# Patient Record
Sex: Male | Born: 1975 | Race: White | Hispanic: No | Marital: Married | Smoking: Former smoker
Health system: Southern US, Community
[De-identification: ages and names within clinical notes are randomized; demographics above are authoritative.]

## PROBLEM LIST (undated history)

## (undated) HISTORY — PX: NO PAST SURGERIES: SHX2092

---

## 2007-08-28 ENCOUNTER — Ambulatory Visit: Payer: Self-pay | Admitting: Internal Medicine

## 2007-09-07 ENCOUNTER — Ambulatory Visit: Payer: Self-pay | Admitting: Internal Medicine

## 2008-06-08 IMAGING — CT CT ABDOMEN W/ CM
1 of 2 series · 15 of 32 positions shown, 20 images · non-contrast
Comparison: none

REASON FOR EXAM: abnormal LFT's    abd pain
COMMENTS:

[Series 2: soft tissue · axial · 0.98mm/px · z∈[+372,+692]mm · 15 of 46 slices shown, 20 images]
[im 3/46  soft-tissue]
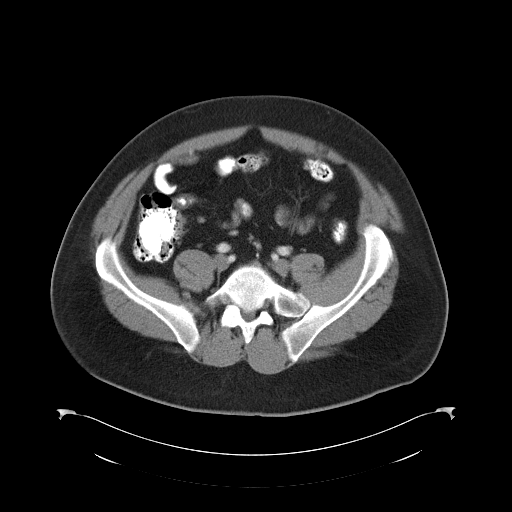
[im 3/46  bone]
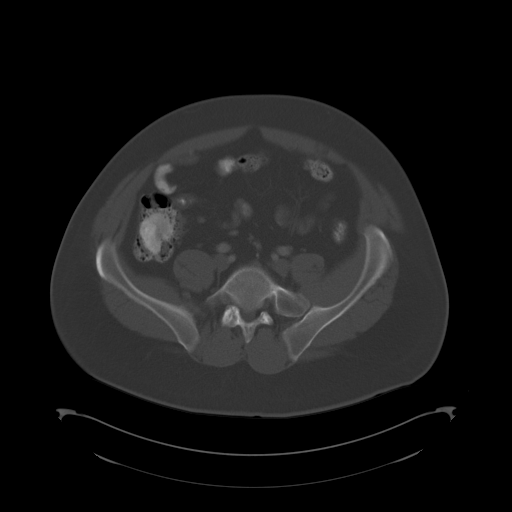
[im 7/46  soft-tissue]
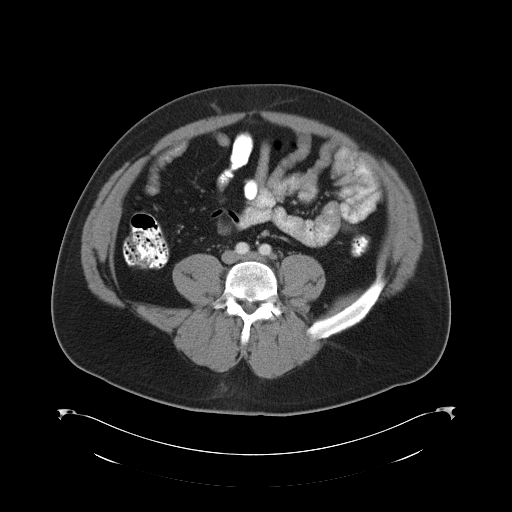
[im 10/46  soft-tissue]
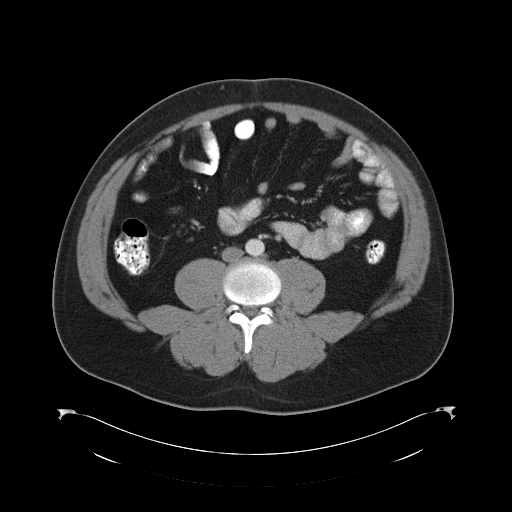
[im 12/46  soft-tissue]
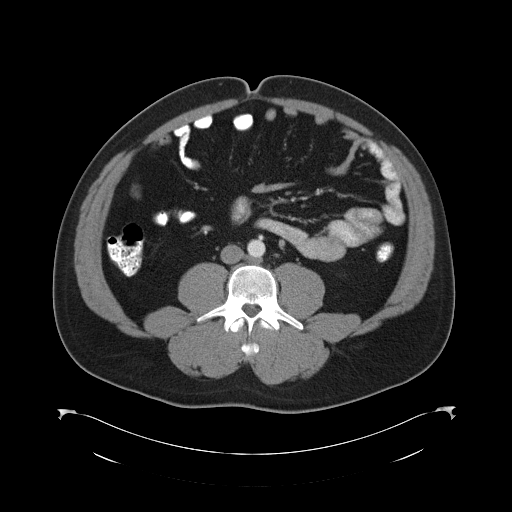
[im 16/46  soft-tissue]
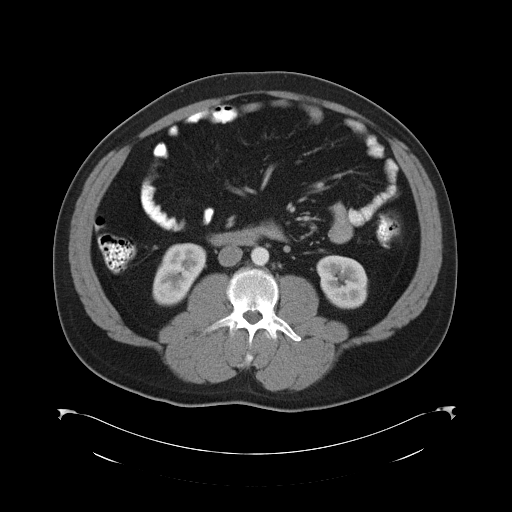
[im 19/46  soft-tissue]
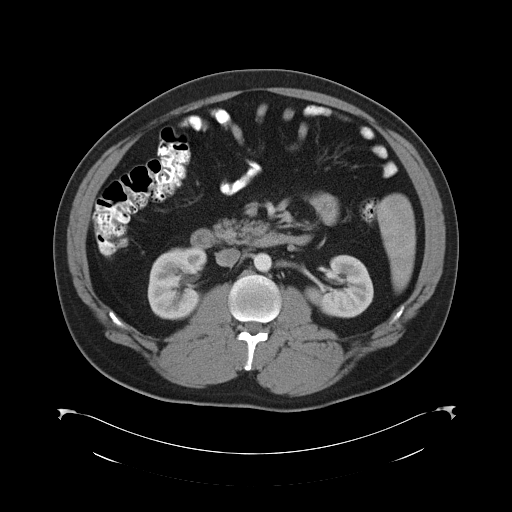
[im 21/46  soft-tissue]
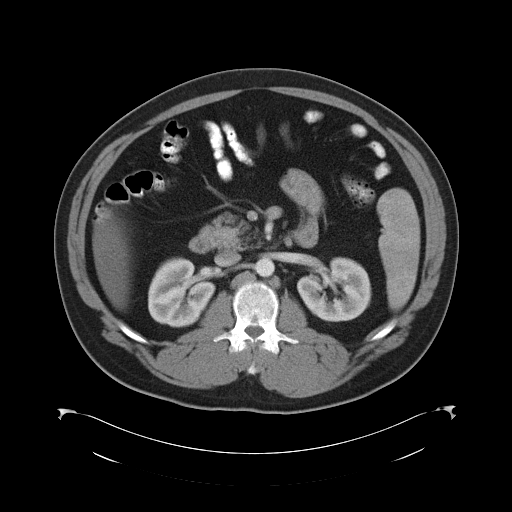
[im 25/46  soft-tissue]
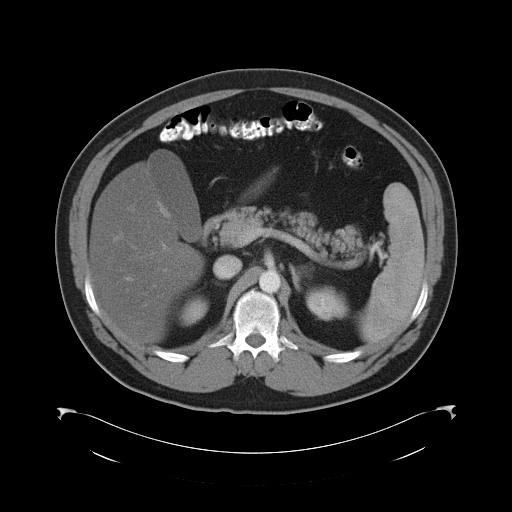
[im 28/46  soft-tissue]
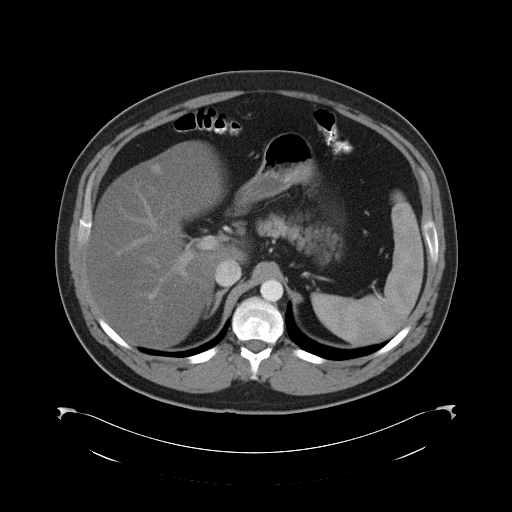
[im 28/46  bone]
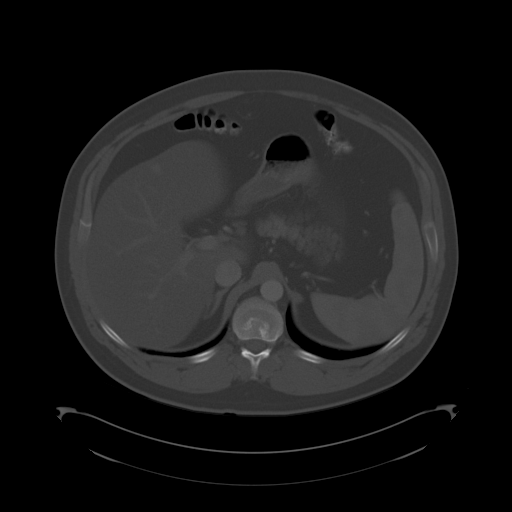
[im 30/46  soft-tissue]
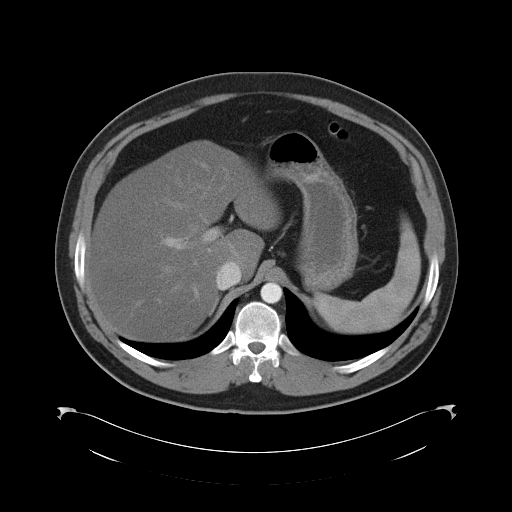
[im 34/46  soft-tissue]
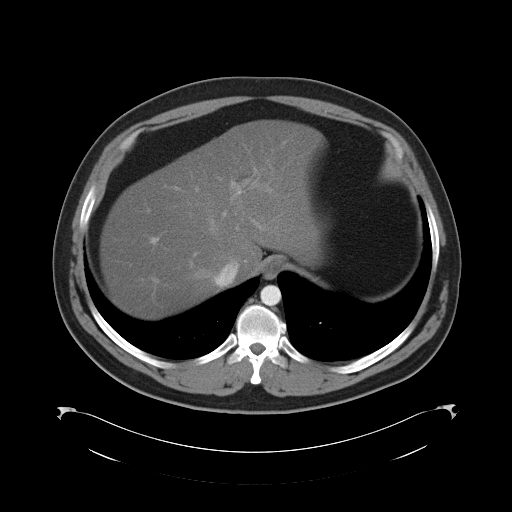
[im 37/46  soft-tissue]
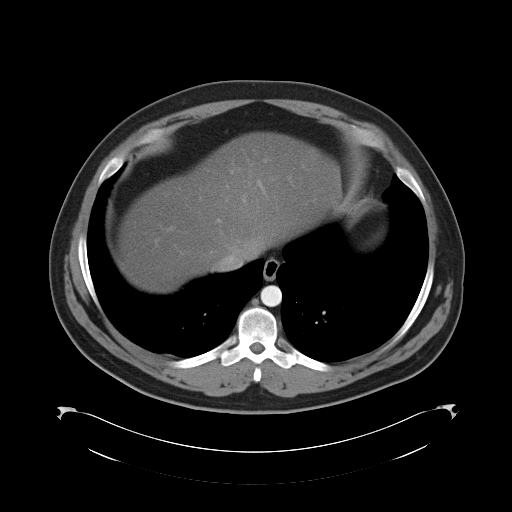
[im 37/46  lung]
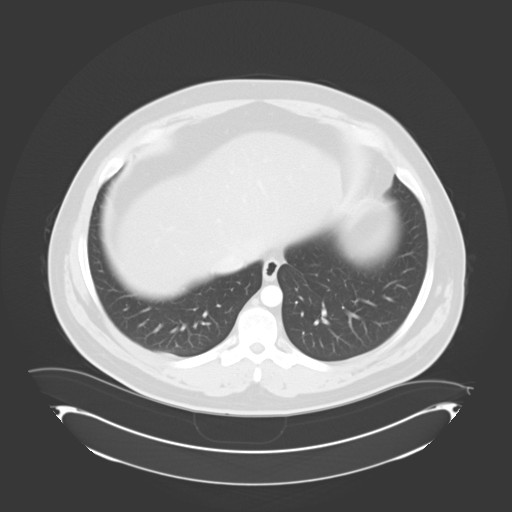
[im 39/46  soft-tissue]
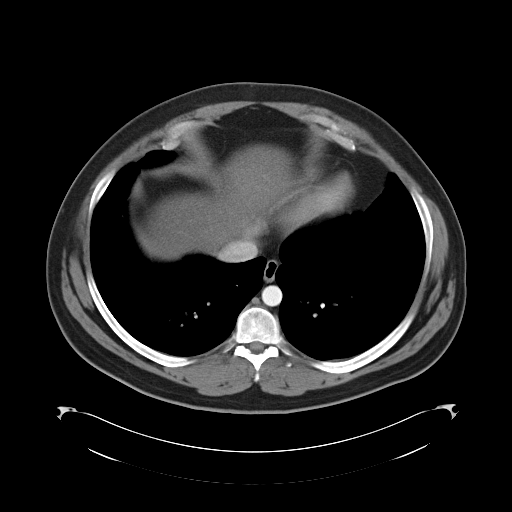
[im 39/46  lung]
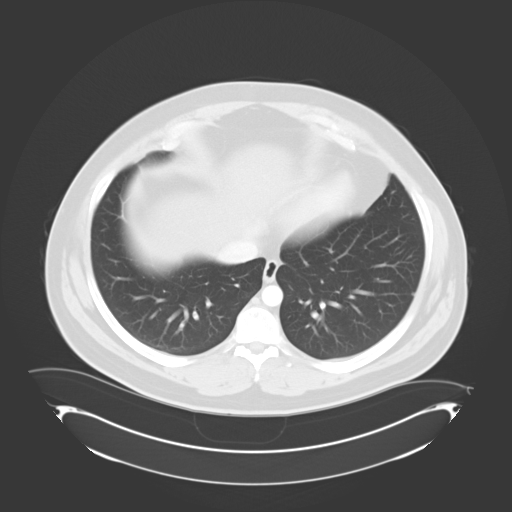
[im 41/46  lung]
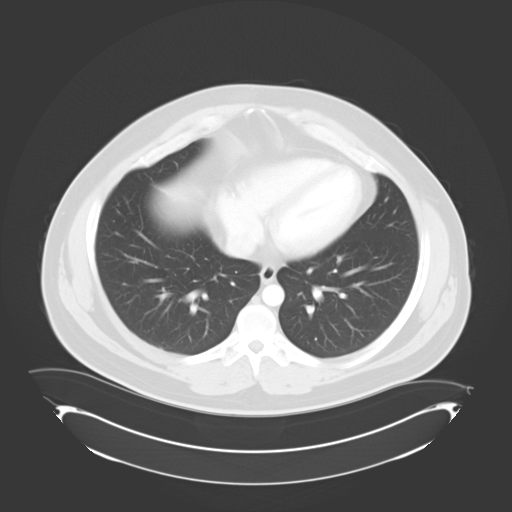
[im 43/46  soft-tissue]
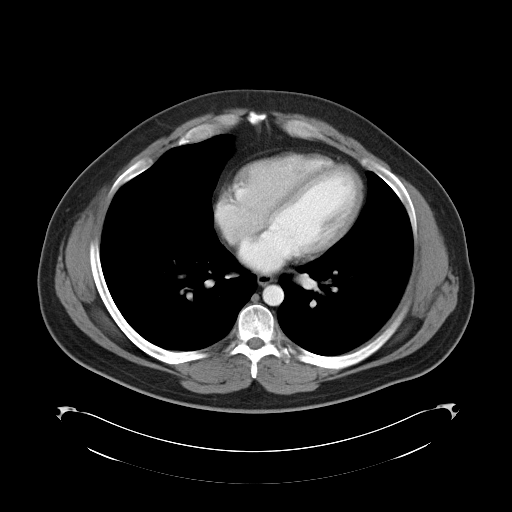
[im 43/46  lung]
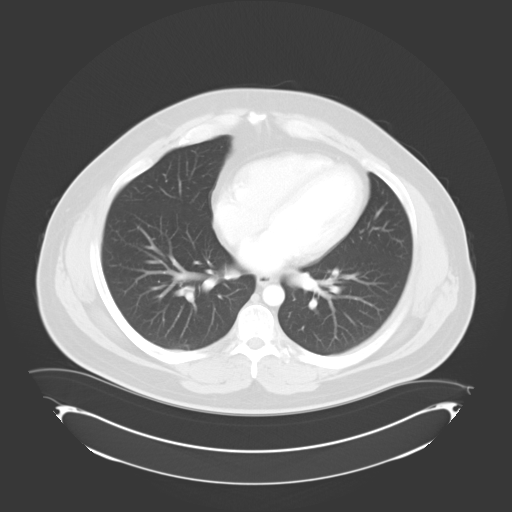

[15 of 32 positions shown; findings below may reference images not displayed]

PROCEDURE:     CT  - CT ABDOMEN STANDARD W  - September 07, 2007  [DATE]

RESULT:     Helical 8 mm sections were obtained from the lung bases through
the superior iliac crests.

Evaluation of the lung bases demonstrates no gross abnormalities. Evaluation
of the liver demonstrates diffuse low-attenuating architecture without
evidence of focal regions of abnormal enhancement nor focal masses. The
spleen, adrenals, pancreas, and kidneys are unremarkable. There is no CT
evidence of abdominal free fluid, loculated fluid collections, masses nor
adenopathy,  nor CT evidence of bowel obstruction, colitis, or appendicitis
within the visualized portions of the abdomen.   There is no CT evidence of
an abdominal aortic aneurysm.
IMPRESSION: Findings consistent with hepatic steatosis without further
focal or acute intra-abdominal abnormalities.

## 2009-01-03 ENCOUNTER — Ambulatory Visit: Payer: Self-pay | Admitting: Internal Medicine

## 2010-07-10 IMAGING — CR DG FOREARM 2V*L*
1 series · 2 of 2 positions shown · non-contrast
Comparison: none

REASON FOR EXAM: injruy
COMMENTS:

PROCEDURE:     MDR - MDR FOREARM LEFT  - January 03, 2009  [DATE]
RESULT:     AP and lateral views of the left forearm were obtained. There is
a comminuted fracture of the distal left radius. No fractures of the
proximal radius are seen. No fracture of the ulna is noted.

[Series 1: view not recorded · 0.17mm/px · 2 of 2 slices shown]
[im 1/2]
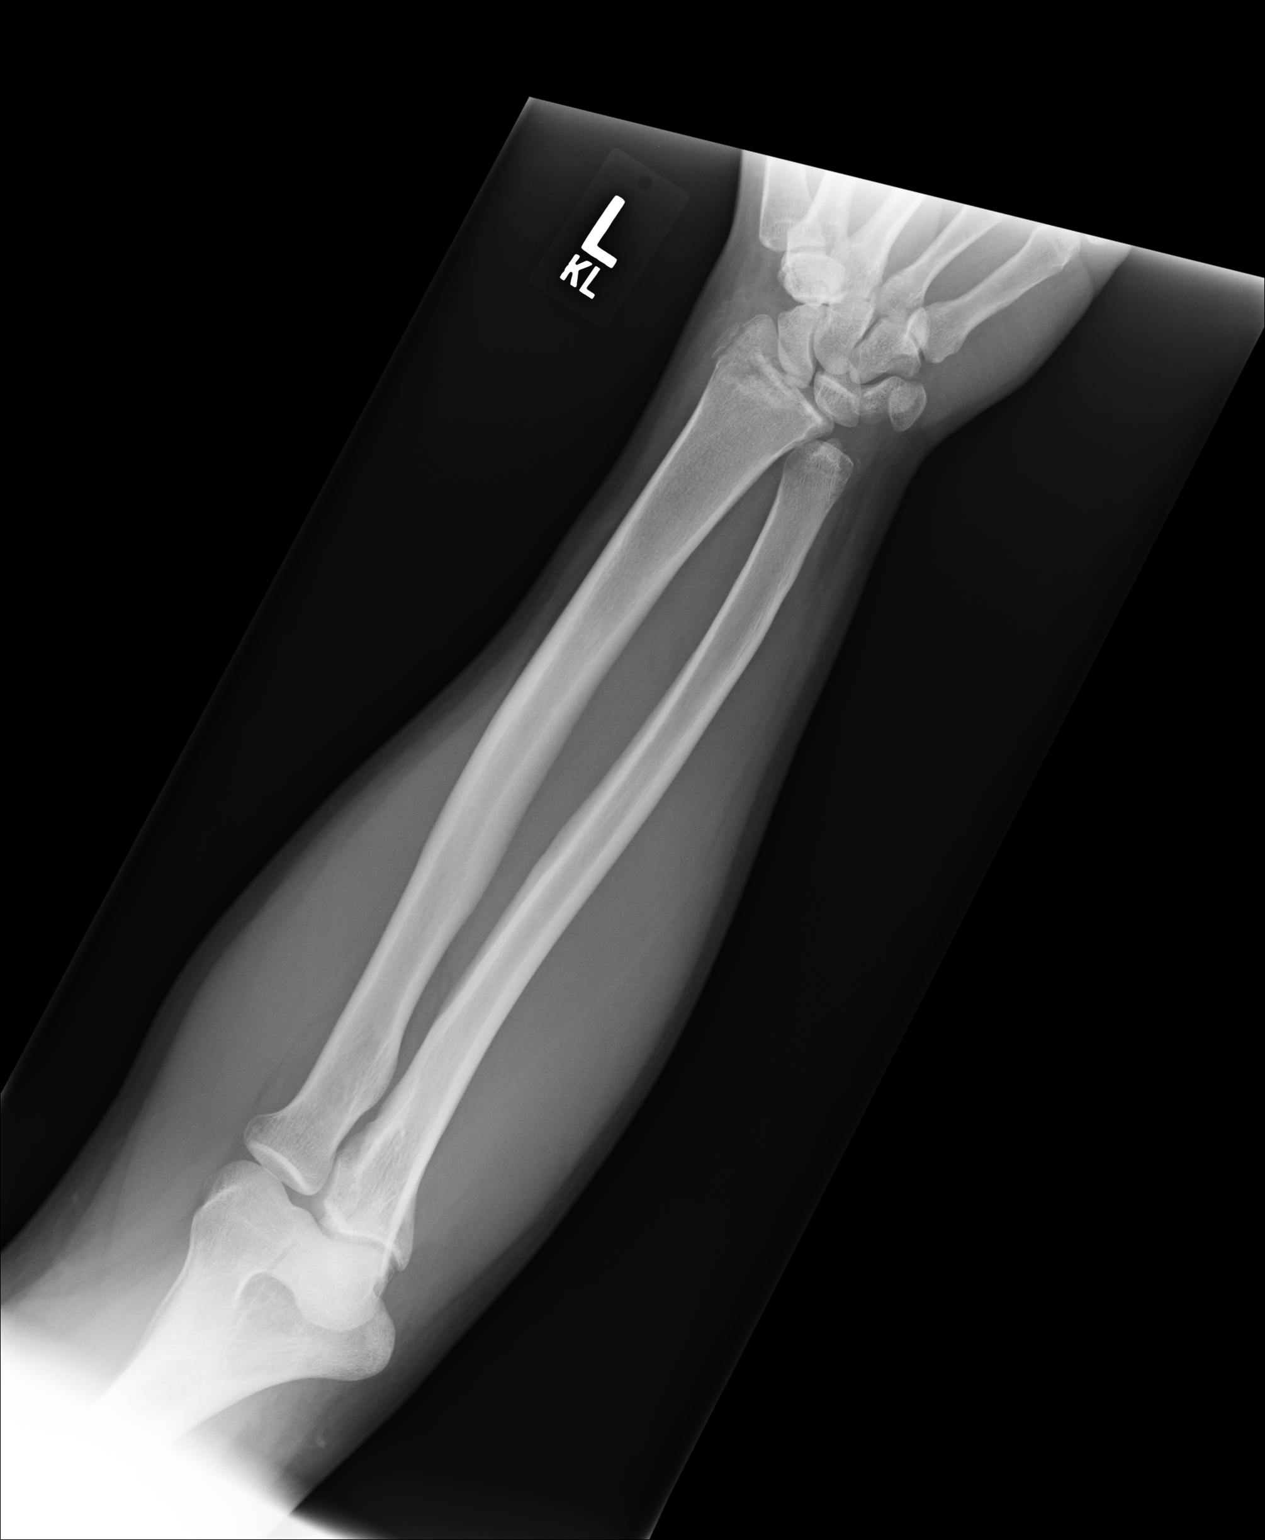
[im 2/2]
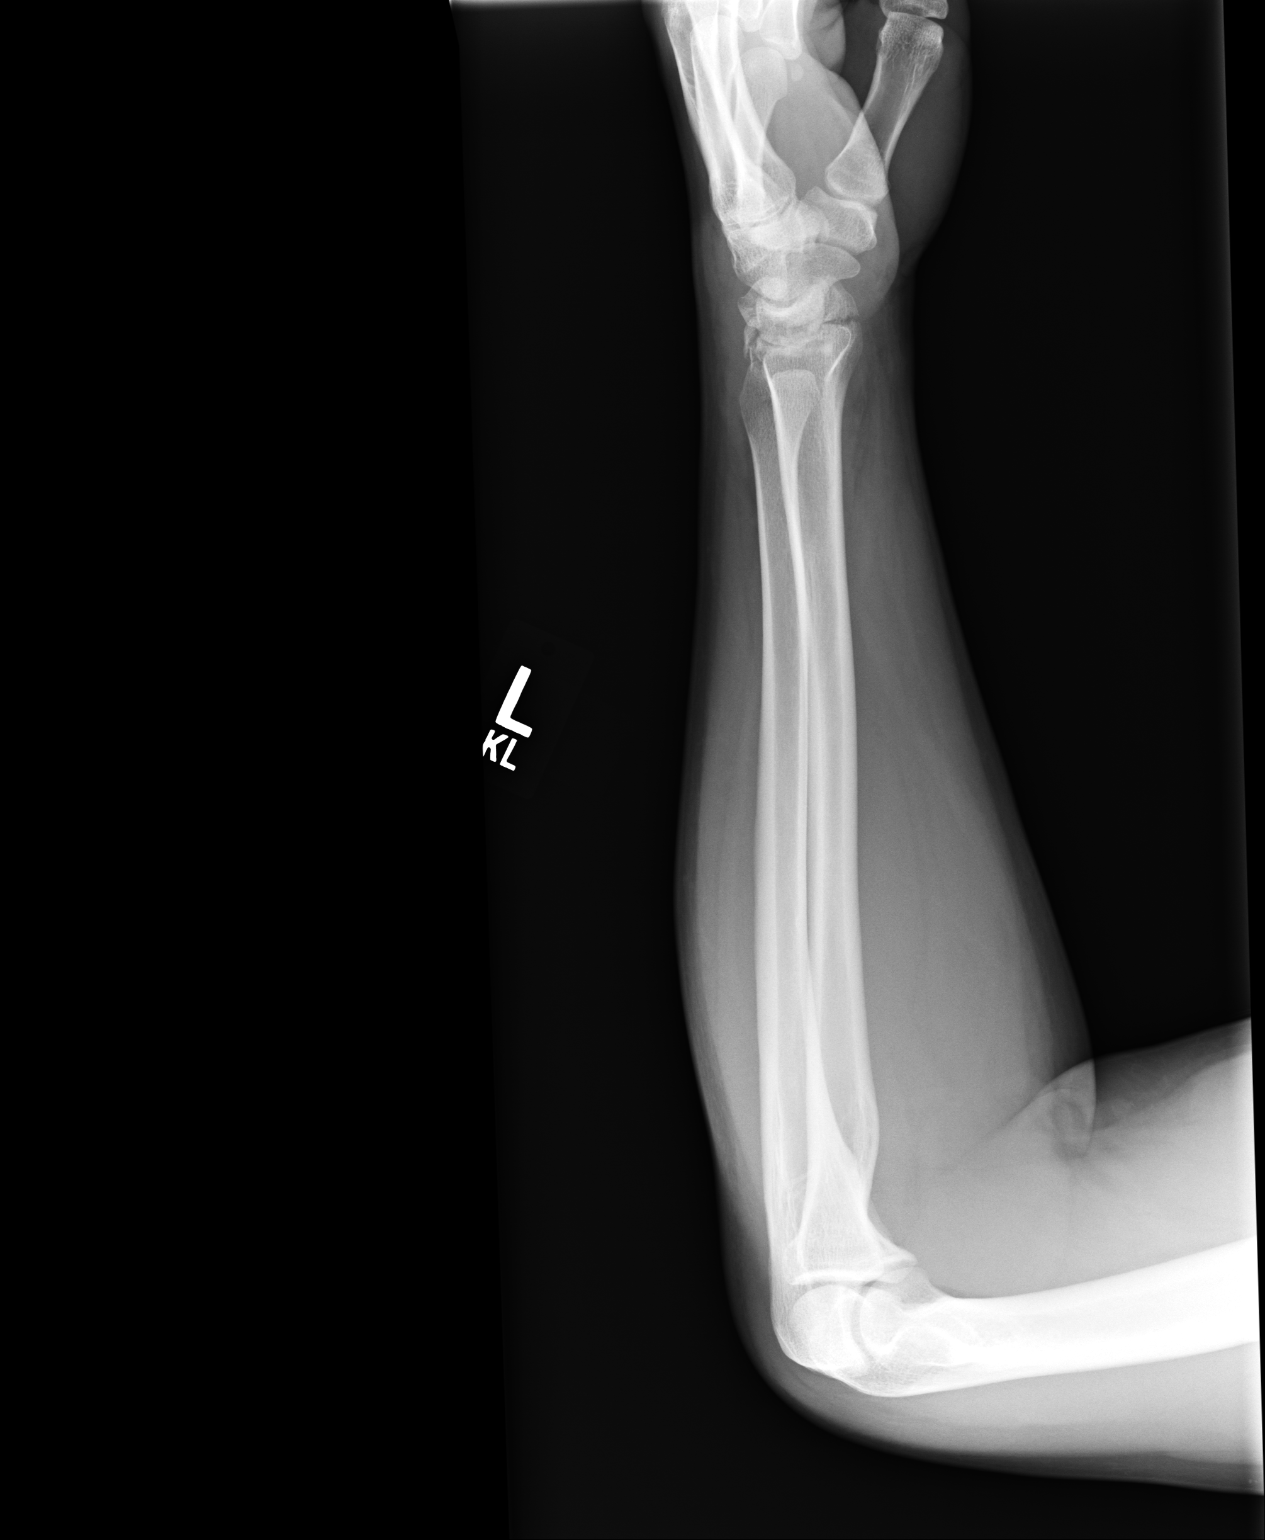

[2 of 2 positions shown; findings below may reference images not displayed]

IMPRESSION: Comminuted fracture of the distal left radius.

## 2016-08-19 ENCOUNTER — Ambulatory Visit: Payer: Self-pay | Admitting: Physician Assistant

## 2016-08-19 ENCOUNTER — Encounter: Payer: Self-pay | Admitting: Physician Assistant

## 2016-08-19 VITALS — BP 144/92 | HR 82 | Temp 98.3°F | Ht 70.0 in | Wt 235.0 lb

## 2016-08-19 DIAGNOSIS — Z0000 Encounter for general adult medical examination without abnormal findings: Secondary | ICD-10-CM

## 2016-08-19 DIAGNOSIS — F5102 Adjustment insomnia: Secondary | ICD-10-CM

## 2016-08-19 MED ORDER — TRAZODONE HCL 150 MG PO TABS: ORAL_TABLET | ORAL | 3 refills | Status: DC

## 2016-08-19 NOTE — Progress Notes (Signed)
S: pt here for wellness physical for insurance purposes, states he had his biometrics done at work, does not have a pcp, states he use to see dr sparks years ago, had elevated bp but became normal after losing about 100lbs, states he is under a lot of stress recently has he and his fiance broke up, no si/hi, having difficulty sleeping, took a lot of benadryl without relief;  No other complaints ros neg. PMH:  Elevated bp Social: dips snuff, nonsmoker, +etoh, no drugs Fam: leukemia, dementia, CAD, htn, ?dm  O: vitals wnl, nad, ENT wnl, neck supple no lymph, lungs c t a, cv rrr, abd soft nontender bs normal all 4 quads, pt is a little nervous  A: wellness, insomnia  P:  Recheck bp, take otc cinnamon, bp may be elevated due to recent caffeine intact and anxiety, rx for trazadone for insomnia

## 2017-03-16 ENCOUNTER — Ambulatory Visit: Payer: Self-pay | Admitting: Physician Assistant

## 2017-03-16 DIAGNOSIS — Z299 Encounter for prophylactic measures, unspecified: Secondary | ICD-10-CM

## 2017-03-16 NOTE — Progress Notes (Signed)
Patient came in to have his scheduled flu vaccine. 

## 2017-03-21 ENCOUNTER — Ambulatory Visit: Payer: Self-pay | Admitting: Physician Assistant

## 2017-03-30 ENCOUNTER — Ambulatory Visit: Payer: Self-pay | Admitting: Physician Assistant

## 2017-03-30 ENCOUNTER — Encounter: Payer: Self-pay | Admitting: Physician Assistant

## 2017-03-30 VITALS — BP 150/100 | HR 87 | Temp 98.4°F | Wt 259.0 lb

## 2017-03-30 DIAGNOSIS — J069 Acute upper respiratory infection, unspecified: Secondary | ICD-10-CM

## 2017-03-30 DIAGNOSIS — E669 Obesity, unspecified: Secondary | ICD-10-CM

## 2017-03-30 MED ORDER — BENZONATATE 200 MG PO CAPS: 200.0000 mg | ORAL_CAPSULE | Freq: Three times a day (TID) | ORAL | 0 refills | Status: DC | PRN

## 2017-03-30 NOTE — Progress Notes (Signed)
S: Patient here for weight loss consultation, states he is getting short of breath when he runs up steps needs to get the weight off. Has used phentermine previously. Was able to lose a lot of weight on this medication. Also has cough and congestion, had fever on Friday, he is getting better at this time. States he started some leftover amoxicillin. States he has enough to do 7 days worth of medication.  O: vitals w elevated bp, weight 259lb; TMs clear, throat is irritated, neck is supple, lungs are clear to auscultation, heart sounds are normal.  A: obesity, acute uri  P: Discussed diet options with patient, instructed him to cut carbs and eat more protein with vegetables. Explained to him that since I am leaving the clinic I don't feel comfortable starting him on phentermine. We'll call in [place] for his cough

## 2017-06-03 ENCOUNTER — Other Ambulatory Visit: Payer: Self-pay

## 2017-06-03 ENCOUNTER — Ambulatory Visit
Admission: EM | Admit: 2017-06-03 | Discharge: 2017-06-03 | Disposition: A | Discharge status: Home / Self Care | Payer: Managed Care, Other (non HMO) | Attending: Family Medicine | Admitting: Family Medicine

## 2017-06-03 DIAGNOSIS — R05 Cough: Secondary | ICD-10-CM | POA: Diagnosis not present

## 2017-06-03 DIAGNOSIS — M62830 Muscle spasm of back: Secondary | ICD-10-CM | POA: Diagnosis not present

## 2017-06-03 DIAGNOSIS — J029 Acute pharyngitis, unspecified: Secondary | ICD-10-CM

## 2017-06-03 LAB — RAPID STREP SCREEN (MED CTR MEBANE ONLY): Streptococcus, Group A Screen (Direct): NEGATIVE

## 2017-06-03 MED ORDER — AZITHROMYCIN 250 MG PO TABS: 250.0000 mg | ORAL_TABLET | Freq: Every day | ORAL | 0 refills | Status: DC

## 2017-06-03 MED ORDER — CYCLOBENZAPRINE HCL 10 MG PO TABS: 10.0000 mg | ORAL_TABLET | Freq: Two times a day (BID) | ORAL | 0 refills | Status: DC | PRN

## 2017-06-03 NOTE — ED Provider Notes (Signed)
[facility]    CSN: [id number] Arrival date & time: 06/03/17  0906     History   Chief Complaint Chief Complaint  Patient presents with  . Sore Throat    HPI Michael Coffey is a 42 y.o. male.   HPI  42 year old male presents with a sore throat that feels like he "swallowed a bucket of nails" congestion, body aches and fatigue that started suddenly yesterday.  He works as a Glass blower/designer at [employer] A coworker was diagnosed with strep throat here last week.  Has been in close contact with him.  Temperature of 99.1 O2 sats at 90%.  He does admit to a mild cough that is nonproductive.  He states whenever he coughs it hurts his throat so badly he tries to avoid it.       History reviewed. No pertinent past medical history.  There are no active problems to display for this patient.   Past Surgical History:  Procedure Laterality Date  . NO PAST SURGERIES         Home Medications    Prior to Admission medications   Medication Sig Start Date End Date Taking? Authorizing Provider  azithromycin (ZITHROMAX) 250 MG tablet Take 1 tablet (250 mg total) by mouth daily. Take first 2 tablets together, then 1 every day until finished. 06/03/17   Lutricia Feil, PA-C  cyclobenzaprine (FLEXERIL) 10 MG tablet Take 1 tablet (10 mg total) by mouth 2 (two) times daily as needed for muscle spasms. 06/03/17   Lutricia Feil, PA-C    Family History Family History  Problem Relation Age of Onset  . Leukemia Mother   . Hypertension Father   . Hypertension Brother   . Dementia Maternal Grandmother   . Heart disease Maternal Grandfather   . Heart disease Paternal Grandfather     Social History Social History   Tobacco Use  . Smoking status: Former Games developer  . Smokeless tobacco: Current User    Types: Snuff  Substance Use Topics  . Alcohol use: Yes    Comment: rarely  . Drug use: No     Allergies   Patient has no known allergies.   Review of Systems Review  of Systems  Constitutional: Positive for activity change, chills, fatigue and fever.  HENT: Positive for congestion, postnasal drip and sore throat.   Respiratory: Positive for cough.   All other systems reviewed and are negative.    Physical Exam Triage Vital Signs ED Triage Vitals  Enc Vitals Group     BP 06/03/17 0930 (!) 146/80     Pulse Rate 06/03/17 0930 93     Resp 06/03/17 0930 18     Temp 06/03/17 0930 99.1 F (37.3 C)     Temp Source 06/03/17 0930 Oral     SpO2 06/03/17 0930 98 %     Weight 06/03/17 0926 250 lb (113.4 kg)     Height 06/03/17 0926 5\' 10"  (1.778 m)     Head Circumference --      Peak Flow --      Pain Score 06/03/17 0926 10     Pain Loc --      Pain Edu? --      Excl. in GC? --    No data found.  Updated Vital Signs BP (!) 146/80 (BP Location: Left Arm)   Pulse 93   Temp 99.1 F (37.3 C) (Oral)   Resp 18   Ht 5\' 10"  (1.778 m)  Wt 250 lb (113.4 kg)   SpO2 98%   BMI 35.87 kg/m   Visual Acuity Right Eye Distance:   Left Eye Distance:   Bilateral Distance:    Right Eye Near:   Left Eye Near:    Bilateral Near:     Physical Exam  Constitutional: He is oriented to person, place, and time. He appears well-developed and well-nourished.  Non-toxic appearance. He does not appear ill. No distress.  HENT:  Head: Normocephalic.  Right Ear: Hearing and tympanic membrane normal.  Left Ear: Hearing and tympanic membrane normal.  Mouth/Throat: Mucous membranes are normal. Posterior oropharyngeal edema and posterior oropharyngeal erythema present. No oropharyngeal exudate or tonsillar abscesses. Tonsils are 2+ on the right. Tonsils are 1+ on the left. No tonsillar exudate.  Eyes: Pupils are equal, round, and reactive to light.  Neck: Normal range of motion.  Pulmonary/Chest: Effort normal and breath sounds normal.  Lymphadenopathy:    He has no cervical adenopathy.  Neurological: He is alert and oriented to person, place, and time.  Skin: Skin  is warm and dry.  Psychiatric: He has a normal mood and affect. His behavior is normal.  Nursing note and vitals reviewed.    UC Treatments / Results  Labs (all labs ordered are listed, but only abnormal results are displayed) Labs Reviewed  RAPID STREP SCREEN (NOT AT [facility])  CULTURE, GROUP A STREP [facility])    EKG  EKG Interpretation None       Radiology No results found.  Procedures Procedures (including critical care time)  Medications Ordered in UC Medications - No data to display   Initial Impression / Assessment and Plan / UC Course  I have reviewed the triage vital signs and the nursing notes.  Pertinent labs & imaging results that were available during my care of the patient were reviewed by me and considered in my medical decision making (see chart for details).     Plan: 1. Test/x-ray results and diagnosis reviewed with patient 2. rx as per orders; risks, benefits, potential side effects reviewed with patient 3. Recommend supportive treatment with rest and fluids.  Recommend salt water gargles as necessary for comfort.  May use lozenges or cough drops also.  For your back spasms will provide you with Flexeril which has worked in the past.  Cultures of your throat will be available however I have started him on Z-Pak because of his symptoms physical exam and also association with a strep positive individual.  If he is not improving he should follow-up with Dr. Judithann SheenSparks his primary care physician 4. F/u prn if symptoms worsen or don't improve   Final Clinical Impressions(s) / UC Diagnoses   Final diagnoses:  Sore throat  Spasm of back muscles    ED Discharge Orders        Ordered    azithromycin (ZITHROMAX) 250 MG tablet  Daily     06/03/17 1008    cyclobenzaprine (FLEXERIL) 10 MG tablet  2 times daily PRN     06/03/17 1008       Controlled Substance Prescriptions [place] Controlled Substance Registry consulted? Not Applicable   Lutricia FeilRoemer, William P,  PA-C 06/03/17 1019

## 2017-06-03 NOTE — ED Triage Notes (Signed)
Patient complains of sore throat, congestion, cough, body aches and fatigue x suddenly yesterday. 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

## 2017-06-05 LAB — CULTURE, GROUP A STREP (THRC)

## 2017-06-06 ENCOUNTER — Encounter: Payer: Self-pay | Admitting: *Deleted

## 2017-06-06 ENCOUNTER — Ambulatory Visit
Admission: EM | Admit: 2017-06-06 | Discharge: 2017-06-06 | Disposition: A | Discharge status: Home / Self Care | Payer: Managed Care, Other (non HMO) | Attending: Family Medicine | Admitting: Family Medicine

## 2017-06-06 ENCOUNTER — Telehealth: Payer: Self-pay | Admitting: Emergency Medicine

## 2017-06-06 DIAGNOSIS — B9789 Other viral agents as the cause of diseases classified elsewhere: Secondary | ICD-10-CM

## 2017-06-06 DIAGNOSIS — J029 Acute pharyngitis, unspecified: Secondary | ICD-10-CM

## 2017-06-06 DIAGNOSIS — R509 Fever, unspecified: Secondary | ICD-10-CM | POA: Diagnosis not present

## 2017-06-06 LAB — MONONUCLEOSIS SCREEN: MONO SCREEN: NEGATIVE

## 2017-06-06 LAB — RAPID STREP SCREEN (MED CTR MEBANE ONLY): Streptococcus, Group A Screen (Direct): NEGATIVE

## 2017-06-06 MED ORDER — PREDNISONE 20 MG PO TABS: ORAL_TABLET | ORAL | 0 refills | Status: DC

## 2017-06-06 MED ORDER — LIDOCAINE VISCOUS 2 % MT SOLN: OROMUCOSAL | 0 refills | Status: DC

## 2017-06-06 NOTE — ED Triage Notes (Signed)
Sore throat, fever, chills,  xseveral days. Seen here Friday for same.

## 2017-06-06 NOTE — ED Provider Notes (Signed)
[facility]    CSN: [id number] Arrival date & time: 06/06/17  1021     History   Chief Complaint Chief Complaint  Patient presents with  . Sore Throat  . Fever    HPI Michael Coffey is a 42 y.o. male.   42 yo male seen 4 days ago with 2 days of sore throat and given z-pak due to exposure to strep, however strep test was negative. Patient now returns stating he's not doing much better. States today feels slightly better but expected to be much better by now with antibiotic. Denies any fevers, chills, cough, shortness of breath.    The history is provided by the patient.  Sore Throat   Fever    History reviewed. No pertinent past medical history.  There are no active problems to display for this patient.   Past Surgical History:  Procedure Laterality Date  . NO PAST SURGERIES         Home Medications    Prior to Admission medications   Medication Sig Start Date End Date Taking? Authorizing Provider  azithromycin (ZITHROMAX) 250 MG tablet Take 1 tablet (250 mg total) by mouth daily. Take first 2 tablets together, then 1 every day until finished. 06/03/17  Yes Lutricia Feil, PA-C  cyclobenzaprine (FLEXERIL) 10 MG tablet Take 1 tablet (10 mg total) by mouth 2 (two) times daily as needed for muscle spasms. 06/03/17  Yes Lutricia Feil, PA-C  lidocaine (XYLOCAINE) 2 % solution 20 mls gargle and spit q 6 hours prn 06/06/17   Payton Mccallum, MD  predniSONE (DELTASONE) 20 MG tablet 3 tabs po qd x 2 days, then 2 tabs po qd x 2 days, then 1 tab po qd x 2 days, then half a tab po qd x 2 days 06/06/17   Payton Mccallum, MD    Family History Family History  Problem Relation Age of Onset  . Leukemia Mother   . Hypertension Father   . Hypertension Brother   . Dementia Maternal Grandmother   . Heart disease Maternal Grandfather   . Heart disease Paternal Grandfather     Social History Social History   Tobacco Use  . Smoking status: Former Games developer  .  Smokeless tobacco: Current User    Types: Snuff  Substance Use Topics  . Alcohol use: Yes    Comment: rarely  . Drug use: No     Allergies   Patient has no known allergies.   Review of Systems Review of Systems  Constitutional: Positive for fever.     Physical Exam Triage Vital Signs ED Triage Vitals  Enc Vitals Group     BP 06/06/17 1039 (!) 155/93     Pulse Rate 06/06/17 1039 (!) 108     Resp 06/06/17 1039 16     Temp 06/06/17 1039 98.6 F (37 C)     Temp Source 06/06/17 1039 Oral     SpO2 06/06/17 1039 99 %     Weight 06/06/17 1044 250 lb (113.4 kg)     Height 06/06/17 1044 5\' 10"  (1.778 m)     Head Circumference --      Peak Flow --      Pain Score 06/06/17 1046 9     Pain Loc --      Pain Edu? --      Excl. in GC? --    No data found.  Updated Vital Signs BP (!) 155/93 (BP Location: Left Arm)   Pulse Marland Kitchen)  108   Temp 98.6 F (37 C) (Oral)   Resp 16   Ht 5\' 10"  (1.778 m)   Wt 250 lb (113.4 kg)   SpO2 99%   BMI 35.87 kg/m   Visual Acuity Right Eye Distance:   Left Eye Distance:   Bilateral Distance:    Right Eye Near:   Left Eye Near:    Bilateral Near:     Physical Exam  Constitutional: He appears well-developed and well-nourished. No distress.  HENT:  Head: Normocephalic and atraumatic.  Right Ear: Tympanic membrane, external ear and ear canal normal.  Left Ear: Tympanic membrane, external ear and ear canal normal.  Nose: Nose normal.  Mouth/Throat: Uvula is midline and mucous membranes are normal. Posterior oropharyngeal erythema present. No oropharyngeal exudate or tonsillar abscesses.  Eyes: Conjunctivae and EOM are normal. Pupils are equal, round, and reactive to light. Right eye exhibits no discharge. Left eye exhibits no discharge. No scleral icterus.  Neck: Normal range of motion. Neck supple. No tracheal deviation present. No thyromegaly present.  Cardiovascular: Normal rate, regular rhythm and normal heart sounds.  Pulmonary/Chest:  Effort normal and breath sounds normal. No stridor. No respiratory distress. He has no wheezes. He has no rales. He exhibits no tenderness.  Lymphadenopathy:    He has no cervical adenopathy.  Neurological: He is alert.  Skin: Skin is warm and dry. No rash noted. He is not diaphoretic.  Nursing note and vitals reviewed.    UC Treatments / Results  Labs (all labs ordered are listed, but only abnormal results are displayed) Labs Reviewed  RAPID STREP SCREEN (NOT AT [facility])  CULTURE, GROUP A STREP [facility])  MONONUCLEOSIS SCREEN    EKG  EKG Interpretation None       Radiology No results found.  Procedures Procedures (including critical care time)  Medications Ordered in UC Medications - No data to display   Initial Impression / Assessment and Plan / UC Course  I have reviewed the triage vital signs and the nursing notes.  Pertinent labs & imaging results that were available during my care of the patient were reviewed by me and considered in my medical decision making (see chart for details).       Final Clinical Impressions(s) / UC Diagnoses   Final diagnoses:  Viral pharyngitis    ED Discharge Orders        Ordered    lidocaine (XYLOCAINE) 2 % solution     06/06/17 1126    predniSONE (DELTASONE) 20 MG tablet     06/06/17 1126     1. Lab results and diagnosis reviewed with patient 2. rx as per orders above; reviewed possible side effects, interactions, risks and benefits  3. Recommend supportive treatment with otc analgesics, fluids, salt water gargles 4. Follow-up prn if symptoms worsen or don't improve  Controlled Substance Prescriptions [place] Controlled Substance Registry consulted? Not Applicable   Payton Mccallumonty, Tiah Heckel, MD 06/06/17 1220

## 2017-06-06 NOTE — Telephone Encounter (Signed)
Called patient and advised of negative throat culture. Patient is on Zithromax. Patient states that he is not feeling any better. He states he has white spots on his throat, fever, body aches. He has been out of work all weekend. He thinks that we didn't swab his throat good enough. Advised that we would be glad to re-evaluate patient or he could follow up with PCP since he is not improving, but the Zpak will cover strep throat. This could be viral. Patient voiced understanding.

## 2017-06-09 LAB — CULTURE, GROUP A STREP (THRC)

## 2017-09-13 ENCOUNTER — Ambulatory Visit: Payer: Self-pay | Admitting: Family Medicine

## 2017-09-13 VITALS — BP 148/96 | HR 78 | Temp 98.4°F | Resp 16 | Ht 70.0 in | Wt 250.0 lb

## 2017-09-13 DIAGNOSIS — Z0189 Encounter for other specified special examinations: Principal | ICD-10-CM

## 2017-09-13 DIAGNOSIS — Z008 Encounter for other general examination: Secondary | ICD-10-CM

## 2017-09-13 DIAGNOSIS — R030 Elevated blood-pressure reading, without diagnosis of hypertension: Secondary | ICD-10-CM

## 2017-09-13 DIAGNOSIS — Z0000 Encounter for general adult medical examination without abnormal findings: Secondary | ICD-10-CM

## 2017-09-13 LAB — GLUCOSE, POCT (MANUAL RESULT ENTRY): POC Glucose: 108 mg/dl — AB (ref 70–99)

## 2017-09-13 NOTE — Progress Notes (Signed)
Subjective: Annual biometrics screening  Patient presents for his annual biometric screening.  Patient denies any medical history except for elevated blood pressure and allergic rhinitis.  Patient reports that he was taking medications in the past for hypertension  but was able to come off of these medications due to increased exercise and improved diet.  Patient reports his primary care provider is Dr. Judithann SheenSparks but that he has not seen Dr. Judithann SheenSparks for 2 years.  Patient has a scheduled appointment with Dr. Judithann SheenSparks in August.  Patient reports that his blood pressure is usually over 140/90 when he has it checked by the nurse at work.  Patient reports drinking a Public librarianMonster energy drink this morning prior to his appointment.  Blood pressure today by manual measurement is 148/96.  Patient says he plans to resume a healthy diet and increase his exercise to improve his blood pressure.  Denies cardiac symptoms. Patient works at the [employer]. Patient denies any other issues or concerns.   Review of Systems Unremarkable  Objective  Physical Exam General: Awake, alert and oriented. No acute distress. Well developed, hydrated and nourished. Appears stated age.  HEENT: Supple neck without adenopathy. Sclera is non-icteric. The ear canal is clear without discharge. The tympanic membrane is normal in appearance with normal landmarks and cone of light. Nasal mucosa is pink and moist. Oral mucosa is pink and moist. The pharynx is normal in appearance without tonsillar swelling or exudates.  Skin: Skin in warm, dry and intact without rashes or lesions. Appropriate color for ethnicity. Cardiac: Heart rate and rhythm are normal. No murmurs, gallops, or rubs are auscultated.  Respiratory: The chest wall is symmetric and without deformity. No signs of respiratory distress. Lung sounds are clear in all lobes bilaterally without rales, ronchi, or wheezes.  Neurological: The patient is awake, alert and oriented to person,  place, and time with normal speech.  Memory is normal and thought processes intact. No gait abnormalities are appreciated.  Psychiatric: Appropriate mood and affect.   Assessment Annual biometrics screening  Plan  Lipid panel pending. Encouraged routine visits with primary care provider.  Advised patient to call Dr. Judithann SheenSparks office to schedule an appointment for as soon as possible to follow-up on his elevated blood pressure. Nonfasting blood sugar is 108 today. Discussed elevated blood pressure and the risks of untreated hypertension.  Advised patient to monitor this at least daily and keep a log of these measurements.  Discussed normal values and advised him to report abnormal values to his primary care provider.  Advised patient that if his blood pressure continues to be elevated and he is unable to see his primary care provider that he needs to return to the clinic for reevaluation and consideration of treatment for hypertension.  Discussed that this was more appropriate for his primary care provider to treat though if possible, since this is an acute care clinic.  Baseline labs drawn today.  Encouraged a healthy, well-balanced diet and regular exercise. Red flag symptoms and indications to seek medical care discussed.

## 2017-09-14 LAB — CBC WITH DIFFERENTIAL/PLATELET
BASOS: 1 %
Basophils Absolute: 0.1 10*3/uL (ref 0.0–0.2)
EOS (ABSOLUTE): 0.3 10*3/uL (ref 0.0–0.4)
Eos: 4 %
HEMOGLOBIN: 17.6 g/dL (ref 13.0–17.7)
Hematocrit: 51.1 % — ABNORMAL HIGH (ref 37.5–51.0)
IMMATURE GRANS (ABS): 0 10*3/uL (ref 0.0–0.1)
Immature Granulocytes: 0 %
LYMPHS: 29 %
Lymphocytes Absolute: 2.6 10*3/uL (ref 0.7–3.1)
MCH: 33 pg (ref 26.6–33.0)
MCHC: 34.4 g/dL (ref 31.5–35.7)
MCV: 96 fL (ref 79–97)
MONOCYTES: 8 %
Monocytes Absolute: 0.7 10*3/uL (ref 0.1–0.9)
NEUTROS ABS: 5.2 10*3/uL (ref 1.4–7.0)
Neutrophils: 58 %
Platelets: 316 10*3/uL (ref 150–379)
RBC: 5.34 x10E6/uL (ref 4.14–5.80)
RDW: 13.6 % (ref 12.3–15.4)
WBC: 8.9 10*3/uL (ref 3.4–10.8)

## 2017-09-14 LAB — COMPREHENSIVE METABOLIC PANEL
ALBUMIN: 5.1 g/dL (ref 3.5–5.5)
ALT: 54 IU/L — ABNORMAL HIGH (ref 0–44)
AST: 34 IU/L (ref 0–40)
Albumin/Globulin Ratio: 2.3 — ABNORMAL HIGH (ref 1.2–2.2)
Alkaline Phosphatase: 62 IU/L (ref 39–117)
BILIRUBIN TOTAL: 1 mg/dL (ref 0.0–1.2)
BUN/Creatinine Ratio: 11 (ref 9–20)
BUN: 11 mg/dL (ref 6–24)
CALCIUM: 10 mg/dL (ref 8.7–10.2)
CHLORIDE: 101 mmol/L (ref 96–106)
CO2: 24 mmol/L (ref 20–29)
CREATININE: 1.03 mg/dL (ref 0.76–1.27)
GFR, EST AFRICAN AMERICAN: 105 mL/min/{1.73_m2} (ref >59)
GFR, EST NON AFRICAN AMERICAN: 90 mL/min/{1.73_m2} (ref >59)
GLUCOSE: 90 mg/dL (ref 65–99)
Globulin, Total: 2.2 g/dL (ref 1.5–4.5)
Potassium: 4.4 mmol/L (ref 3.5–5.2)
Sodium: 142 mmol/L (ref 134–144)
TOTAL PROTEIN: 7.3 g/dL (ref 6.0–8.5)

## 2017-09-14 LAB — LIPID PANEL
CHOL/HDL RATIO: 3.8 ratio (ref 0.0–5.0)
CHOLESTEROL TOTAL: 173 mg/dL (ref 100–199)
HDL: 45 mg/dL (ref >39)
LDL Calculated: 104 mg/dL — ABNORMAL HIGH (ref 0–99)
TRIGLYCERIDES: 121 mg/dL (ref 0–149)
VLDL Cholesterol Cal: 24 mg/dL (ref 5–40)

## 2017-09-15 NOTE — Progress Notes (Signed)
Dear Mr. Michael Coffey, I wanted to let you know that your lab work came back and your lipid panel is normal, with the exception of your LDL cholesterol.  Your LDL cholesterol ("bad cholesterol") is just slightly elevated at 104, normal values are between 0 and 99.  Your CBC is normal, with the exception of your hematocrit, which is just slightly elevated at 51.1%.  Normal values for this are between 37.5 and 51%.  This is a very minor elevation and a common cause for this is not drinking enough fluid prior to testing.  Your complete metabolic panel was also normal with 2 minor exceptions.  Your albumin/globulin ratio was just slightly elevated at 2.3.  Normal values are between 1.2 and 2.2.  Many things can cause this to be elevated, including not drinking enough fluid. Your ALT, a measure of liver function, was also just slightly elevated at 54.  Normal values are between 0 and 44. Transient elevations such as this are not uncommon and could be caused by many things, including some medications.  I wanted you to be aware of these results because I want you to follow-up with your primary care provider within the next month so they can repeat testing if necessary.

## 2018-10-31 ENCOUNTER — Other Ambulatory Visit: Payer: Self-pay | Admitting: Orthopedic Surgery

## 2018-10-31 DIAGNOSIS — M5412 Radiculopathy, cervical region: Secondary | ICD-10-CM

## 2018-11-21 ENCOUNTER — Ambulatory Visit: Payer: Managed Care, Other (non HMO)

## 2022-06-16 ENCOUNTER — Other Ambulatory Visit: Payer: Self-pay | Admitting: Family Medicine

## 2022-06-16 DIAGNOSIS — Z026 Encounter for examination for insurance purposes: Secondary | ICD-10-CM

## 2022-06-16 DIAGNOSIS — M79661 Pain in right lower leg: Secondary | ICD-10-CM

## 2022-06-16 DIAGNOSIS — S8781XA Crushing injury of right lower leg, initial encounter: Secondary | ICD-10-CM

## 2022-06-17 ENCOUNTER — Other Ambulatory Visit: Payer: Self-pay | Admitting: Family Medicine

## 2022-06-17 ENCOUNTER — Ambulatory Visit
Admission: RE | Admit: 2022-06-17 | Discharge: 2022-06-17 | Disposition: A | Discharge status: Home / Self Care | Payer: No Typology Code available for payment source | Source: Ambulatory Visit | Attending: Family Medicine | Admitting: Family Medicine

## 2022-06-17 DIAGNOSIS — Z026 Encounter for examination for insurance purposes: Secondary | ICD-10-CM | POA: Diagnosis present

## 2022-06-17 DIAGNOSIS — M79661 Pain in right lower leg: Secondary | ICD-10-CM

## 2022-06-17 DIAGNOSIS — S8781XA Crushing injury of right lower leg, initial encounter: Secondary | ICD-10-CM

## 2024-03-05 ENCOUNTER — Other Ambulatory Visit: Payer: Self-pay | Admitting: Internal Medicine

## 2024-03-05 DIAGNOSIS — R7989 Other specified abnormal findings of blood chemistry: Secondary | ICD-10-CM

## 2024-03-13 ENCOUNTER — Ambulatory Visit
Admission: RE | Admit: 2024-03-13 | Discharge: 2024-03-13 | Disposition: A | Discharge status: Home / Self Care | Source: Ambulatory Visit | Attending: Internal Medicine | Admitting: Internal Medicine

## 2024-03-13 DIAGNOSIS — R7989 Other specified abnormal findings of blood chemistry: Secondary | ICD-10-CM | POA: Insufficient documentation

## 2024-03-16 ENCOUNTER — Other Ambulatory Visit: Payer: Self-pay | Admitting: Internal Medicine

## 2024-03-16 DIAGNOSIS — K7689 Other specified diseases of liver: Secondary | ICD-10-CM

## 2024-04-04 ENCOUNTER — Other Ambulatory Visit

## 2024-04-10 ENCOUNTER — Ambulatory Visit
Admission: RE | Admit: 2024-04-10 | Discharge: 2024-04-10 | Disposition: A | Discharge status: Home / Self Care | Source: Ambulatory Visit | Attending: Internal Medicine | Admitting: Internal Medicine

## 2024-04-10 DIAGNOSIS — K7689 Other specified diseases of liver: Secondary | ICD-10-CM

## 2024-04-10 MED ORDER — GADOPICLENOL 0.5 MMOL/ML IV SOLN
10.0000 mL | Freq: Once | INTRAVENOUS | Status: AC | PRN
  Administered 2024-04-10: 10 mL via INTRAVENOUS
